# Patient Record
Sex: Male | Born: 1965 | Race: White | Hispanic: No | Marital: Married
Health system: Southern US, Community
[De-identification: ages and names within clinical notes are randomized; demographics above are authoritative.]

---

## 2019-09-30 ENCOUNTER — Encounter (HOSPITAL_COMMUNITY)
Admission: EM | Disposition: A | Discharge status: Home / Self Care | Payer: Self-pay | Source: Home / Self Care | Attending: Neurosurgery

## 2019-09-30 ENCOUNTER — Encounter (HOSPITAL_COMMUNITY): Payer: Self-pay | Admitting: Emergency Medicine

## 2019-09-30 ENCOUNTER — Inpatient Hospital Stay (HOSPITAL_COMMUNITY)
Admission: EM | Admit: 2019-09-30 | Discharge: 2019-10-07 | DRG: 066 | Disposition: A | Discharge status: Home / Self Care | Payer: BC Managed Care – PPO | Attending: Neurosurgery | Admitting: Neurosurgery

## 2019-09-30 ENCOUNTER — Emergency Department (HOSPITAL_COMMUNITY): Payer: BC Managed Care – PPO

## 2019-09-30 ENCOUNTER — Inpatient Hospital Stay (HOSPITAL_COMMUNITY): Payer: BC Managed Care – PPO | Admitting: Certified Registered"

## 2019-09-30 ENCOUNTER — Inpatient Hospital Stay (HOSPITAL_COMMUNITY): Payer: BC Managed Care – PPO

## 2019-09-30 DIAGNOSIS — I609 Nontraumatic subarachnoid hemorrhage, unspecified: Secondary | ICD-10-CM | POA: Diagnosis not present

## 2019-09-30 DIAGNOSIS — Z20822 Contact with and (suspected) exposure to covid-19: Secondary | ICD-10-CM | POA: Diagnosis not present

## 2019-09-30 DIAGNOSIS — R001 Bradycardia, unspecified: Secondary | ICD-10-CM | POA: Diagnosis not present

## 2019-09-30 DIAGNOSIS — Z79899 Other long term (current) drug therapy: Secondary | ICD-10-CM | POA: Diagnosis not present

## 2019-09-30 HISTORY — PX: IR ANGIO VERTEBRAL SEL VERTEBRAL BILAT MOD SED: IMG5369

## 2019-09-30 HISTORY — PX: RADIOLOGY WITH ANESTHESIA: SHX6223

## 2019-09-30 HISTORY — PX: IR ANGIO INTRA EXTRACRAN SEL INTERNAL CAROTID BILAT MOD SED: IMG5363

## 2019-09-30 LAB — COMPREHENSIVE METABOLIC PANEL
ALT: 52 U/L — ABNORMAL HIGH (ref 0–44)
AST: 39 U/L (ref 15–41)
Albumin: 4.9 g/dL (ref 3.5–5.0)
Alkaline Phosphatase: 75 U/L (ref 38–126)
Anion gap: 11 (ref 5–15)
BUN: 14 mg/dL (ref 6–20)
CO2: 26 mmol/L (ref 22–32)
Calcium: 9.4 mg/dL (ref 8.9–10.3)
Chloride: 102 mmol/L (ref 98–111)
Creatinine, Ser: 0.86 mg/dL (ref 0.61–1.24)
GFR calc Af Amer: >60 mL/min (ref >60)
GFR calc non Af Amer: >60 mL/min (ref >60)
Glucose, Bld: 116 mg/dL — ABNORMAL HIGH (ref 70–99)
Potassium: 4.6 mmol/L (ref 3.5–5.1)
Sodium: 139 mmol/L (ref 135–145)
Total Bilirubin: 1.2 mg/dL (ref 0.3–1.2)
Total Protein: 7.2 g/dL (ref 6.5–8.1)

## 2019-09-30 LAB — SARS CORONAVIRUS 2 BY RT PCR (HOSPITAL ORDER, PERFORMED IN ~~LOC~~ HOSPITAL LAB): SARS Coronavirus 2: NEGATIVE

## 2019-09-30 LAB — CBC
HCT: 46.7 % (ref 39.0–52.0)
Hemoglobin: 15.8 g/dL (ref 13.0–17.0)
MCH: 31.6 pg (ref 26.0–34.0)
MCHC: 33.8 g/dL (ref 30.0–36.0)
MCV: 93.4 fL (ref 80.0–100.0)
Platelets: 201 10*3/uL (ref 150–400)
RBC: 5 MIL/uL (ref 4.22–5.81)
RDW: 12.6 % (ref 11.5–15.5)
WBC: 7.8 10*3/uL (ref 4.0–10.5)
nRBC: 0 % (ref 0.0–0.2)

## 2019-09-30 LAB — PROTIME-INR
INR: 1.1 (ref 0.8–1.2)
Prothrombin Time: 13.7 seconds (ref 11.4–15.2)

## 2019-09-30 LAB — MRSA PCR SCREENING: MRSA by PCR: NEGATIVE

## 2019-09-30 LAB — APTT: aPTT: 25 seconds (ref 24–36)

## 2019-09-30 SURGERY — IR WITH ANESTHESIA
Anesthesia: Monitor Anesthesia Care

## 2019-09-30 MED ORDER — STROKE: EARLY STAGES OF RECOVERY BOOK
Freq: Once | Status: DC
  Filled 2019-09-30: qty 1

## 2019-09-30 MED ORDER — IOHEXOL 300 MG/ML  SOLN
150.0000 mL | Freq: Once | INTRAMUSCULAR | Status: AC | PRN
  Administered 2019-09-30: 60 mL via INTRA_ARTERIAL

## 2019-09-30 MED ORDER — NIMODIPINE 30 MG PO CAPS
60.0000 mg | ORAL_CAPSULE | ORAL | Status: DC
  Administered 2019-09-30 – 2019-10-07 (×39): 60 mg via ORAL
  Filled 2019-09-30 (×45): qty 2

## 2019-09-30 MED ORDER — DOCUSATE SODIUM 100 MG PO CAPS
100.0000 mg | ORAL_CAPSULE | Freq: Two times a day (BID) | ORAL | Status: DC
  Administered 2019-10-02 – 2019-10-04 (×6): 100 mg via ORAL
  Filled 2019-09-30 (×8): qty 1

## 2019-09-30 MED ORDER — CHLORHEXIDINE GLUCONATE CLOTH 2 % EX PADS
6.0000 | MEDICATED_PAD | Freq: Every day | CUTANEOUS | Status: DC
  Administered 2019-10-01 – 2019-10-07 (×4): 6 via TOPICAL

## 2019-09-30 MED ORDER — PANTOPRAZOLE SODIUM 40 MG PO PACK: 40.0000 mg | PACK | Freq: Every day | ORAL | Status: DC

## 2019-09-30 MED ORDER — LACTATED RINGERS IV SOLN: INTRAVENOUS | Status: DC

## 2019-09-30 MED ORDER — LABETALOL HCL 5 MG/ML IV SOLN
20.0000 mg | Freq: Once | INTRAVENOUS | Status: DC
  Filled 2019-09-30: qty 4

## 2019-09-30 MED ORDER — NIMODIPINE 6 MG/ML PO SOLN
60.0000 mg | Freq: Once | ORAL | Status: AC
  Administered 2019-09-30: 60 mg via ORAL
  Filled 2019-09-30: qty 20

## 2019-09-30 MED ORDER — ATORVASTATIN CALCIUM 40 MG PO TABS
40.0000 mg | ORAL_TABLET | Freq: Every day | ORAL | Status: DC
  Administered 2019-09-30 – 2019-10-07 (×8): 40 mg via ORAL
  Filled 2019-09-30 (×8): qty 1

## 2019-09-30 MED ORDER — MORPHINE SULFATE (PF) 4 MG/ML IV SOLN
4.0000 mg | Freq: Once | INTRAVENOUS | Status: AC
  Administered 2019-09-30: 4 mg via INTRAVENOUS
  Filled 2019-09-30: qty 1

## 2019-09-30 MED ORDER — NICARDIPINE HCL IN NACL 20-0.86 MG/200ML-% IV SOLN
0.0000 mg/h | INTRAVENOUS | Status: DC
  Administered 2019-09-30: 5 mg/h via INTRAVENOUS
  Filled 2019-09-30 (×2): qty 200

## 2019-09-30 MED ORDER — LIDOCAINE HCL 1 % IJ SOLN
INTRAMUSCULAR | Status: AC | PRN
  Administered 2019-09-30: 10 mL

## 2019-09-30 MED ORDER — MORPHINE SULFATE (PF) 2 MG/ML IV SOLN
1.0000 mg | INTRAVENOUS | Status: DC | PRN
  Administered 2019-09-30: 2 mg via INTRAVENOUS
  Administered 2019-09-30: 4 mg via INTRAVENOUS
  Administered 2019-10-04: 1 mg via INTRAVENOUS
  Filled 2019-09-30: qty 2
  Filled 2019-09-30 (×2): qty 1

## 2019-09-30 MED ORDER — PANTOPRAZOLE SODIUM 40 MG PO TBEC
40.0000 mg | DELAYED_RELEASE_TABLET | Freq: Every day | ORAL | Status: DC
  Administered 2019-09-30 – 2019-10-07 (×8): 40 mg via ORAL
  Filled 2019-09-30 (×8): qty 1

## 2019-09-30 MED ORDER — LIDOCAINE HCL 1 % IJ SOLN
INTRAMUSCULAR | Status: AC
  Filled 2019-09-30: qty 20

## 2019-09-30 MED ORDER — ACETAMINOPHEN 160 MG/5ML PO SOLN: 650.0000 mg | ORAL | Status: DC | PRN

## 2019-09-30 MED ORDER — MIDAZOLAM HCL 2 MG/2ML IJ SOLN
INTRAMUSCULAR | Status: DC | PRN
Start: 2019-09-30 — End: 2019-09-30
  Administered 2019-09-30 (×2): 2 mg via INTRAVENOUS

## 2019-09-30 MED ORDER — LACTATED RINGERS IV SOLN
INTRAVENOUS | Status: DC | PRN
Start: 2019-09-30 — End: 2019-09-30

## 2019-09-30 MED ORDER — ONDANSETRON 4 MG PO TBDP
4.0000 mg | ORAL_TABLET | Freq: Four times a day (QID) | ORAL | Status: DC | PRN
  Administered 2019-10-06: 4 mg via ORAL
  Filled 2019-09-30: qty 1

## 2019-09-30 MED ORDER — ACETAMINOPHEN 325 MG PO TABS
650.0000 mg | ORAL_TABLET | ORAL | Status: DC | PRN
  Administered 2019-09-30 – 2019-10-04 (×17): 650 mg via ORAL
  Administered 2019-10-04: 325 mg via ORAL
  Administered 2019-10-04 – 2019-10-07 (×14): 650 mg via ORAL
  Filled 2019-09-30 (×32): qty 2

## 2019-09-30 MED ORDER — NICARDIPINE HCL IN NACL 20-0.86 MG/200ML-% IV SOLN
0.0000 mg/h | INTRAVENOUS | Status: DC
  Administered 2019-09-30: 2.5 mg/h via INTRAVENOUS
  Filled 2019-09-30: qty 200

## 2019-09-30 MED ORDER — CHLORHEXIDINE GLUCONATE 0.12 % MT SOLN
15.0000 mL | Freq: Once | OROMUCOSAL | Status: DC
  Filled 2019-09-30 (×2): qty 15

## 2019-09-30 MED ORDER — IOHEXOL 350 MG/ML SOLN
75.0000 mL | Freq: Once | INTRAVENOUS | Status: AC | PRN
  Administered 2019-09-30: 75 mL via INTRAVENOUS

## 2019-09-30 MED ORDER — NIMODIPINE 6 MG/ML PO SOLN
60.0000 mg | ORAL | Status: DC
  Filled 2019-09-30 (×27): qty 10

## 2019-09-30 MED ORDER — ONDANSETRON HCL 4 MG/2ML IJ SOLN
4.0000 mg | Freq: Four times a day (QID) | INTRAMUSCULAR | Status: DC | PRN
  Administered 2019-10-01 – 2019-10-07 (×6): 4 mg via INTRAVENOUS
  Filled 2019-09-30 (×6): qty 2

## 2019-09-30 MED ORDER — IOHEXOL 240 MG/ML SOLN
INTRAMUSCULAR | Status: AC
  Filled 2019-09-30: qty 100

## 2019-09-30 MED ORDER — SODIUM CHLORIDE 0.9 % IV SOLN: INTRAVENOUS | Status: DC

## 2019-09-30 MED ORDER — ACETAMINOPHEN 650 MG RE SUPP: 650.0000 mg | RECTAL | Status: DC | PRN

## 2019-09-30 NOTE — Anesthesia Procedure Notes (Signed)
Procedure Name: MAC Date/Time: 09/30/2019 7:13 PM Performed by: Claris Che, CRNA Pre-anesthesia Checklist: Patient identified, Emergency Drugs available, Suction available, Patient being monitored and Timeout performed Patient Re-evaluated:Patient Re-evaluated prior to induction Oxygen Delivery Method: Simple face mask

## 2019-09-30 NOTE — Anesthesia Preprocedure Evaluation (Addendum)
Anesthesia Evaluation  Patient identified by MRN, date of birth, ID band Patient awake    Reviewed: Allergy & Precautions, H&P , NPO status , Patient's Chart, lab work & pertinent test results  Airway Mallampati: III  TM Distance: >3 FB Neck ROM: Full    Dental no notable dental hx. (+) Teeth Intact, Dental Advisory Given   Pulmonary neg pulmonary ROS,    Pulmonary exam normal breath sounds clear to auscultation       Cardiovascular negative cardio ROS   Rhythm:Regular Rate:Normal     Neuro/Psych  Headaches, negative psych ROS   GI/Hepatic negative GI ROS, Neg liver ROS,   Endo/Other  negative endocrine ROS  Renal/GU negative Renal ROS  negative genitourinary   Musculoskeletal   Abdominal   Peds  Hematology negative hematology ROS (+)   Anesthesia Other Findings   Reproductive/Obstetrics negative OB ROS                            Anesthesia Physical Anesthesia Plan  ASA: II  Anesthesia Plan: MAC   Post-op Pain Management:    Induction: Intravenous  PONV Risk Score and Plan: 1 and Midazolam and Treatment may vary due to age or medical condition  Airway Management Planned: Nasal Cannula  Additional Equipment:   Intra-op Plan:   Post-operative Plan:   Informed Consent: I have reviewed the patients History and Physical, chart, labs and discussed the procedure including the risks, benefits and alternatives for the proposed anesthesia with the patient or authorized representative who has indicated his/her understanding and acceptance.     Dental advisory given  Plan Discussed with: CRNA  Anesthesia Plan Comments:       Anesthesia Quick Evaluation

## 2019-09-30 NOTE — H&P (Signed)
Chief Complaint   Chief Complaint  Patient presents with  . Headache    History of Present Illness  [facility] is a 54 y.o. male presented to the emergency department with sudden onset of severe headache approximately 530 this morning.  This happened while he was getting up out of bed.  There was associated nausea and vomiting.  He attempted to drive to work but had continued headache and vomiting and went to the urgent care instead.  He was ultimately transferred to [facility] where CT scan demonstrated basal subarachnoid hemorrhage suspicious for aneurysmal etiology.  He was then transferred to [facility] for further care.  Upon questioning, the patient denies any associated blurry or double vision, numbness, tingling, or weakness.  He did not have any difficulty walking.  No difficulty with urination.  Of note, the patient does not have a history of hypertension, cardiac disease, or any other medical problems.  He does take a statin.  He is a non-smoker.  He has a cousin when he was a child who died of aneurysmal subarachnoid hemorrhage, but other no known family members with aneurysms or brain hemorrhage.  Past Medical History  History reviewed. No pertinent past medical history.  Past Surgical History  History reviewed. No pertinent surgical history.  Social History   Social History   Tobacco Use  . Smoking status: Not on file  Substance Use Topics  . Alcohol use: Not on file  . Drug use: Not on file    Medications   Prior to Admission medications   Medication Sig Start Date End Date Taking? Authorizing Provider  atorvastatin (LIPITOR) 40 MG tablet Take 40 mg by mouth daily. 09/21/19  Yes [provider]  cholecalciferol (VITAMIN D3) 25 MCG (1000 UNIT) tablet Take 1,000 Units by mouth daily.   Yes [provider]  ibuprofen (ADVIL) 200 MG tablet Take 200 mg by mouth every 6 (six) hours as needed for headache or moderate pain.   Yes [provider]  Multiple Vitamins-Minerals (ZINC PO) Take 1 tablet by mouth daily.   Yes [provider]    Allergies  No Known Allergies  Review of Systems  ROS  Neurologic Exam  Awake, alert, oriented Memory and concentration grossly intact Speech fluent, appropriate CN grossly intact Motor exam: Upper Extremities Deltoid Bicep Tricep Grip  Right 5/5 5/5 5/5 5/5  Left 5/5 5/5 5/5 5/5   Lower Extremities IP Quad PF DF EHL  Right 5/5 5/5 5/5 5/5 5/5  Left 5/5 5/5 5/5 5/5 5/5   Sensation grossly intact to LT  Imaging  CT scan of the brain was personally reviewed.  This demonstrates primarily posterior fossa subarachnoid hemorrhage including perimedullary and perimesencephalic hemorrhage.  There is minimal amount of supratentorial hemorrhage.  Ventricles are somewhat prominent.  CT angiogram of the brain was also personally reviewed.  I do not see any intracranial aneurysms.  Impression  - 54 y.o. male presenting with subarachnoid hemorrhage, Hunt Hess grade 2, Fisher grade 3.  Initial CT angiogram was negative.  Patient is neurologically at baseline.  Plan  -We will plan on proceeding with diagnostic cerebral angiogram for further work-up.  I did review the imaging findings thus far with the patient.  I explained him that while aneurysm is the most common cause of subarachnoid hemorrhage given the negative CTA which is of reasonable quality I think this may be a venous hemorrhage.  We did discuss the need for confirmatory gold standard testing  with angiogram.  I discussed the details of the procedure as well as the associated risks including very low risk of stroke, arterial dissection, contrast reaction, nephropathy, and groin hematoma.  In the unlikely event of positive angiogram, we did also discuss the possibility of endovascular treatment of an aneurysm with coil embolization versus surgical clip ligation.  Patient appeared to understand our discussion and is  willing to proceed with the angiogram.  All his questions were answered.  Informed consent was then obtained and witnessed.

## 2019-09-30 NOTE — Transfer of Care (Signed)
Immediate Anesthesia Transfer of Care Note  Patient: Narada Uzzle  Procedure(s) Performed: IR WITH ANESTHESIA (N/A )  Patient Location: ICU  Anesthesia Type:MAC  Level of Consciousness: awake, alert , oriented and patient cooperative  Airway & Oxygen Therapy: Patient Spontanous Breathing  Post-op Assessment: Report given to RN, Post -op Vital signs reviewed and stable and Patient moving all extremities X 4  Post vital signs: Reviewed and stable  Last Vitals:  Vitals Value Taken Time  BP 123/64 09/30/19 2011  Temp    Pulse 64 09/30/19 2014  Resp 15 09/30/19 2014  SpO2 95 % 09/30/19 2014  Vitals shown include unvalidated device data.  Last Pain:  Vitals:   09/30/19 1711  TempSrc:   PainSc: 4          Complications: No complications documented.

## 2019-09-30 NOTE — Progress Notes (Signed)
Wife Angelique Blonder)- call phone number for her first, if you cannot reach her please call his daughter. Daughter Florentina Addison): [phone]

## 2019-09-30 NOTE — ED Provider Notes (Signed)
[facility]-EMERGENCY DEPT Provider Note   CSN: [id number] Arrival date & time: 09/30/19  1011     History Chief Complaint  Patient presents with  . Headache    Bowden Boody is a 54 y.o. male.  HPI  54 year old male, no significant medical history, presents with headache.  Patient states that he woke up this morning around 5:30 AM with some mild neck pain.  He felt that his neck was stiff and it was from the way that he slept.  He then started having sudden onset of a headache radiating from the neck to the back of the head.  He describes it as an ache and states that the pain was the "worst headache ever had."  He notes he had associated nausea and vomiting.  He drove to urgent care and was told to come to the emergency room for further evaluation.  Patient was given Zofran at urgent care and states since receiving Zofran he has felt significantly better and his headache has improved from an 8 out of 10 to a 3 out of 10.  He denies any numbness, tingling, vision images, speech or gait difficulty.     History reviewed. No pertinent past medical history.  There are no problems to display for this patient.   History reviewed. No pertinent surgical history.     No family history on file.  Social History   Tobacco Use  . Smoking status: Not on file  Substance Use Topics  . Alcohol use: Not on file  . Drug use: Not on file    Home Medications Prior to Admission medications   Medication Sig Start Date End Date Taking? Authorizing Provider  atorvastatin (LIPITOR) 40 MG tablet Take 40 mg by mouth daily. 09/21/19   [provider]    Allergies    Patient has no allergy information on record.  Review of Systems   Review of Systems  Constitutional: Negative for chills and fever.  HENT: Negative for rhinorrhea and sore throat.   Eyes: Negative for visual disturbance.  Respiratory: Negative for cough and shortness of breath.   Cardiovascular:  Negative for chest pain and leg swelling.  Gastrointestinal: Positive for nausea and vomiting. Negative for abdominal pain and diarrhea.  Genitourinary: Negative for dysuria, frequency and urgency.  Musculoskeletal: Negative for joint swelling and neck pain.  Skin: Negative for rash and wound.  Neurological: Positive for headaches. Negative for dizziness, syncope, facial asymmetry, weakness, light-headedness and numbness.  All other systems reviewed and are negative.   Physical Exam Updated Vital Signs BP (!) 144/78   Pulse 68   Temp (!) 97.4 F (36.3 C) (Oral)   Resp 18   SpO2 98%   Physical Exam Vitals and nursing note reviewed.  Constitutional:      Appearance: He is well-developed.  HENT:     Head: Normocephalic and atraumatic.  Eyes:     Conjunctiva/sclera: Conjunctivae normal.  Cardiovascular:     Rate and Rhythm: Normal rate and regular rhythm.     Heart sounds: Normal heart sounds. No murmur heard.   Pulmonary:     Effort: Pulmonary effort is normal. No respiratory distress.     Breath sounds: Normal breath sounds. No wheezing or rales.  Abdominal:     General: Bowel sounds are normal. There is no distension.     Palpations: Abdomen is soft.     Tenderness: There is no abdominal tenderness.  Musculoskeletal:        General:  No tenderness or deformity. Normal range of motion.     Cervical back: Neck supple.  Skin:    General: Skin is warm and dry.     Findings: No erythema or rash.  Neurological:     General: No focal deficit present.     Mental Status: He is alert and oriented to person, place, and time.     Cranial Nerves: Cranial nerves are intact.     Sensory: Sensation is intact.     Motor: Motor function is intact.     Coordination: Coordination is intact.     Gait: Gait is intact.  Psychiatric:        Behavior: Behavior normal.     ED Results / Procedures / Treatments   Labs (all labs ordered are listed, but only abnormal results are  displayed) Labs Reviewed  COMPREHENSIVE METABOLIC PANEL - Abnormal; Notable for the following components:      Result Value   Glucose, Bld 116 (*)    ALT 52 (*)    All other components within normal limits  CBC  URINALYSIS, ROUTINE W REFLEX MICROSCOPIC  APTT  PROTIME-INR    EKG None  Radiology CT Head Wo Contrast  Result Date: 09/30/2019 CLINICAL DATA:  54 year old with headache. EXAM: CT HEAD WITHOUT CONTRAST TECHNIQUE: Contiguous axial images were obtained from the base of the skull through the vertex without intravenous contrast. COMPARISON:  None. FINDINGS: Brain: Positive for subarachnoid hemorrhage. There is blood in the perimesencephalic cistern including the interpeduncular fossa and ambient cisterns. There is also high-density material layering along the left side of the tentorium and difficult to exclude a small amount of subdural blood. Difficult to exclude a small amount of blood in the right sylvian fissure. The lateral ventricles are slightly enlarged and compatible with mild hydrocephalus. No focal intraparenchymal hemorrhage. No evidence for midline shift or large infarct. Vascular: No hyperdense vessel or unexpected calcification. Skull: Normal. Negative for fracture or focal lesion. Sinuses/Orbits: No acute finding. Other: None. IMPRESSION: 1. Positive for intracranial hemorrhage. Subarachnoid hemorrhage in the perimesencephalic cistern. In addition, there is high-density material along the tentorium and cannot exclude small subdural hemorrhage. Findings raise concern for a ruptured aneurysm and recommend further characterization with CTA. 2. Mild hydrocephalus. Critical Value/emergent results were called by telephone at the time of interpretation on 09/30/2019 at 1:01 pm to provider [facility] , who verbally acknowledged these results. Electronically Signed   By: Richarda Overlie M.D.   On: 09/30/2019 13:13    Procedures .Critical Care Performed by: Clayborne Artist,  PA-C Authorized by: Clayborne Artist, PA-C   Critical care provider statement:    Critical care time (minutes):  45   Critical care was necessary to treat or prevent imminent or life-threatening deterioration of the following conditions:  CNS failure or compromise   Critical care was time spent personally by me on the following activities:  Discussions with consultants, evaluation of patient's response to treatment, examination of patient, ordering and performing treatments and interventions, ordering and review of laboratory studies, ordering and review of radiographic studies, pulse oximetry, re-evaluation of patient's condition, obtaining history from patient or surrogate and review of old charts   (including critical care time)  Medications Ordered in ED Medications  niMODipine (NYMALIZE) 6 MG/ML oral solution 60 mg (has no administration in time range)    ED Course  I have reviewed the triage vital signs and the nursing notes.  Pertinent labs & imaging results that were available during  my care of the patient were reviewed by me and considered in my medical decision making (see chart for details).    MDM Rules/Calculators/A&P                           Patient presents with headache.  No significant medical history.  Vital signs stable on arrival.  Neuro exam nonfocal, nonlateralizing.  Blood work ordered in triage.  After evaluation CT head without contrast ordered for further evaluation for subarachnoid hemorrhage.  CT of the head positive for subarachnoid.  Neurosurgery paged.  Discussed findings with patient who expressed understanding.  Discussed with attending, Dr. Lynelle Doctor, who added Nimodipine.  1329: Discussed with neurosurgery, Dr. Maisie Fus.  Request CT angio head and neck.  CT ordered. Patient continues to be very well-appearing, no acute distress, nontoxic, non-lethargic.  Continues to decline any pain medicine.   1416: Discussed again with neurosurgery, Dr. Maisie Fus.  He  request that admit orders to be placed for ICU at [facility] with attending, Dr. Conchita Paris as excepting.  Recommends Cardene drip as needed to maintain a map between 70 and 90. Discussed with case management who will work with security to get patient's belongings from his car.   1530: Reevaluated patient and he continues to be very well-appearing, no acute distress, nontoxic, non-lethargic.  He was started on Cardene drip as his map was slightly elevated above 90.  He is responding well to Cardene drip.  He does note his headache is starting to worsen and describes it as a 5 out of 10.  He is requesting pain medication at this time.  Discussed with attending, Dr. Lynelle Doctor and recommended narcotic pain medication at this time.  Will treat with morphine.  Discussed with before meals and a bed is available at [facility] patient is next on the list and should be getting transferred soon.   Final Clinical Impression(s) / ED Diagnoses Final diagnoses:  Subarachnoid hemorrhage [facility])    Rx / DC Orders ED Discharge Orders    None       Rueben Bash 09/30/19 2033    Linwood Dibbles, MD 10/01/19 [phone]

## 2019-09-30 NOTE — Sedation Documentation (Signed)
Right femoral sheath removed, 60fr exoseal closure device used.

## 2019-09-30 NOTE — Brief Op Note (Signed)
  NEUROSURGERY BRIEF OPERATIVE  NOTE   PREOP DX: Subarachnoid Hemorrhage  POSTOP DX: Same  PROCEDURE: Diagnostic cerebral angiogram  SURGEON: Dr. Lisbeth Renshaw, MD  ANESTHESIA: IV Sedation with Local  EBL: Minimal  SPECIMENS: None  COMPLICATIONS: None  CONDITION: Stable to recovery  FINDINGS (Full report in [facility]): 1. Normal angiogram, no aneurysms, AVM, or fistulas seen.

## 2019-09-30 NOTE — ED Provider Notes (Signed)
Medical screening examination/treatment/procedure(s) were conducted as a shared visit with non-physician practitioner(s) and myself.  I personally evaluated the patient during the encounter.   Pt presents with sudden onset headache.  CT scan shows a subarachnoid hemorrhage.  Pt is currently, awake and alert, CCS 15.  Mild headache.  Discussed with neurosurgery.  Pt will be transferred to [facility] neuro icu.  CT angio is pending.  Case discussed with Dr Maisie Fus.  Dr Conchita Paris will admit.  I have placed a bed request to neuro icu.   Linwood Dibbles, MD 09/30/19 [phone]

## 2019-09-30 NOTE — Progress Notes (Signed)
TOC CM received referral to assist pt with picking up his belongings from his car at the Urgent Care on [address]. Contacted Security and that area is out of their jurisdiction. Pt states his wife is flying into town on tonight and we can arrange [employer] [employer] for her to pick up pt's rental car. ED provider updated.  Isidoro Donning RN CCM, [facility] ED TOC CM [phone]

## 2019-09-30 NOTE — ED Triage Notes (Signed)
Per EMS-headache that radiates to neck and shoulders-was seen at [facility] for same-nausea and vomited-given Zofran which has helped with N/V-sent here for further eval-rapid covid done-has not resulted

## 2019-09-30 NOTE — ED Notes (Signed)
Pt transported to CT ?

## 2019-09-30 NOTE — Anesthesia Postprocedure Evaluation (Signed)
Anesthesia Post Note  Patient: [facility]  Procedure(s) Performed: IR WITH ANESTHESIA (N/A )     Patient location during evaluation: PACU Anesthesia Type: MAC Level of consciousness: awake and alert Pain management: pain level controlled Vital Signs Assessment: post-procedure vital signs reviewed and stable Respiratory status: spontaneous breathing, nonlabored ventilation and respiratory function stable Cardiovascular status: stable and blood pressure returned to baseline Postop Assessment: no apparent nausea or vomiting Anesthetic complications: no   No complications documented.  Last Vitals:  Vitals:   09/30/19 1735 09/30/19 2011  BP: (!) 133/71 (!) 123/64  Pulse: 70   Resp: 16 (!) 10  Temp:  36.8 C  SpO2: 99% 97%    Last Pain:  Vitals:   09/30/19 2011  TempSrc: Oral  PainSc:                  [name],W. EDMOND

## 2019-09-30 NOTE — ED Notes (Signed)
[facility] called for transport. 

## 2019-09-30 NOTE — ED Notes (Signed)
Per charge RN an ICU is being cleaned at [facility] for this patient. This RN called [facility] to go ahead and have pt on the list.

## 2019-10-01 ENCOUNTER — Inpatient Hospital Stay (HOSPITAL_COMMUNITY): Payer: BC Managed Care – PPO

## 2019-10-01 ENCOUNTER — Encounter (HOSPITAL_COMMUNITY): Payer: Self-pay | Admitting: Neurosurgery

## 2019-10-01 DIAGNOSIS — I609 Nontraumatic subarachnoid hemorrhage, unspecified: Secondary | ICD-10-CM

## 2019-10-01 LAB — HIV ANTIBODY (ROUTINE TESTING W REFLEX): HIV Screen 4th Generation wRfx: NONREACTIVE

## 2019-10-01 NOTE — Evaluation (Addendum)
Physical Therapy Evaluation Patient Details Name: Robert Gilbert MRN: [id number] DOB: Oct 05, 1965 Today's Date: 10/01/2019   History of Present Illness  54 y.o. male presented to the emergency department with sudden onset of severe headache approximately 530 this morning.  This happened while he was getting up out of bed.  There was associated nausea and vomiting.  He attempted to drive to work but had continued headache and vomiting and went to the urgent care instead.  He was ultimately transferred to [facility] where CT scan demonstrated basal subarachnoid hemorrhage suspicious for aneurysmal etiology.  He was then transferred to [facility] for further care. Pt underwent diagnostic cerebral angiogram on 7/29 which was normal.  Clinical Impression  Pt presents to PT with no significant functional deficits in mobility, ambulating and negotiating stairs independently. Pt does report some increased nausea when looking downward but denies any dizziness, double vision, or changes in vision during session. Pt reports he feels close to his baseline in terms of mobility at this time and expresses no concerns about mobility at this time. Pt is encouraged to ambulate out of the room at least 3 times per day for the remainder of his hospitalization. Please re-consult acute PT services if any further mobility concerns arise. Acute PT signing off.    Follow Up Recommendations No PT follow up    Equipment Recommendations  None recommended by PT    Recommendations for Other Services       Precautions / Restrictions Precautions Precautions: None Restrictions Weight Bearing Restrictions: No      Mobility  Bed Mobility Overal bed mobility: Independent                Transfers Overall transfer level: Independent                  Ambulation/Gait Ambulation/Gait assistance: Independent Gait Distance (Feet): 300 Feet Assistive device: None Gait Pattern/deviations: Step-through  pattern;WFL(Within Functional Limits) Gait velocity: functional Gait velocity interpretation: >2.62 ft/sec, indicative of community ambulatory General Gait Details: steady step through gait, able to perform horizontal head turns, does report an increase in nausea when looking down, able to change gait speed and stop abruptly without balance deviaitons  Stairs Stairs: Yes Stairs assistance: Independent Stair Management: No rails Number of Stairs: 3    Wheelchair Mobility    Modified Rankin (Stroke Patients Only) Modified Rankin (Stroke Patients Only) Pre-Morbid Rankin Score: No symptoms Modified Rankin: No significant disability     Balance Overall balance assessment: Independent                                           Pertinent Vitals/Pain Pain Assessment: 0-10 Pain Score: 5  Pain Location: head Pain Descriptors / Indicators: Aching Pain Intervention(s): Monitored during session    Home Living Family/patient expects to be discharged to:: Private residence Living Arrangements: Spouse/significant other Available Help at Discharge: Family;Available 24 hours/day Type of Home: House Home Access: Stairs to enter Entrance Stairs-Rails: None Entrance Stairs-Number of Steps: 2 Home Layout: One level Home Equipment: Cane - single point      Prior Function Level of Independence: Independent               Hand Dominance   Dominant Hand: Right    Extremity/Trunk Assessment   Upper Extremity Assessment Upper Extremity Assessment: Overall WFL for tasks assessed    Lower Extremity  Assessment Lower Extremity Assessment: Overall WFL for tasks assessed    Cervical / Trunk Assessment Cervical / Trunk Assessment: Normal  Communication   Communication: No difficulties  Cognition Arousal/Alertness: Awake/alert Behavior During Therapy: WFL for tasks assessed/performed Overall Cognitive Status: Within Functional Limits for tasks assessed                                         General Comments General comments (skin integrity, edema, etc.): VSS on RA    Exercises     Assessment/Plan    PT Assessment Patent does not need any further PT services  PT Problem List         PT Treatment Interventions      PT Goals (Current goals can be found in the Care Plan section)       Frequency     Barriers to discharge        Co-evaluation               AM-PAC PT "6 Clicks" Mobility  Outcome Measure Help needed turning from your back to your side while in a flat bed without using bedrails?: None Help needed moving from lying on your back to sitting on the side of a flat bed without using bedrails?: None Help needed moving to and from a bed to a chair (including a wheelchair)?: None Help needed standing up from a chair using your arms (e.g., wheelchair or bedside chair)?: None Help needed to walk in hospital room?: None Help needed climbing 3-5 steps with a railing? : None 6 Click Score: 24    End of Session   Activity Tolerance: Patient tolerated treatment well Patient left: in chair;with family/visitor present;with call bell/phone within reach Nurse Communication: Mobility status      Time: 9604-5409 PT Time Calculation (min) (ACUTE ONLY): 22 min   Charges:   PT Evaluation $PT Eval Moderate Complexity: 1 Mod          Arlyss Gandy, PT, DPT Acute Rehabilitation Pager: [phone]   Arlyss Gandy 10/01/2019, 9:38 AM

## 2019-10-01 NOTE — Progress Notes (Signed)
Transcranial Doppler  Date POD PCO2 HCT BP  MCA ACA PCA OPHT SIPH VERT Basilar  7/30 GC     Right  Left   *  43   *  *   *  *   18  15   19  19    -28  -24   -41           Right  Left                                            Right  Left                                             Right  Left                                             Right  Left                                            Right  Left                                            Right  Left                                        MCA = Middle Cerebral Artery      OPHT = Opthalmic Artery     BASILAR = Basilar Artery   ACA = Anterior Cerebral Artery     SIPH = Carotid Siphon PCA = Posterior Cerebral Artery   VERT = Verterbral Artery                   Normal MCA = 62+\-12 ACA = 50+\-12 PCA = 42+\-23  * Unable to insonate  10/01/19 12:04 PM 10/03/19 RVT

## 2019-10-01 NOTE — Plan of Care (Signed)
  Problem: Spontaneous Subarachnoid Hemorrhage Tissue Perfusion: Goal: Complications of Spontaneous Subarachnoid Hemorrhage will be minimized Outcome: Progressing

## 2019-10-01 NOTE — Evaluation (Signed)
Occupational Therapy Evaluation Patient Details Name: Robert Gilbert MRN: [id number] DOB: [birth year] Today's Date: 10/01/2019    History of Present Illness 54 y.o. male presented to the emergency department with sudden onset of severe headache approximately 530 this morning.  This happened while he was getting up out of bed.  There was associated nausea and vomiting.  He attempted to drive to work but had continued headache and vomiting and went to the urgent care instead.  He was ultimately transferred to [facility] where CT scan demonstrated basal subarachnoid hemorrhage suspicious for aneurysmal etiology.  He was then transferred to [facility] for further care. Pt underwent diagnostic cerebral angiogram on 7/29 which was normal.   Clinical Impression   Patient evaluated by Occupational Therapy with no further acute OT needs identified. All education has been completed and the patient has no further questions. Pt appears at baseline.  No cognitive or visual deficits noted.   See below for any follow-up Occupational Therapy or equipment needs. OT is signing off. Thank you for this referral.      Follow Up Recommendations  No OT follow up    Equipment Recommendations  None recommended by OT    Recommendations for Other Services       Precautions / Restrictions Precautions Precautions: None Restrictions Weight Bearing Restrictions: No      Mobility Bed Mobility Overal bed mobility: Independent                Transfers Overall transfer level: Independent                    Balance Overall balance assessment: Independent                                         ADL either performed or assessed with clinical judgement   ADL Overall ADL's : Independent                                       General ADL Comments: Pt standing at sink grooming and cleaning up upon OT entrance      Vision Baseline Vision/History: No  visual deficits Patient Visual Report: No change from baseline Vision Assessment?: No apparent visual deficits Additional Comments: Pt able to scan environment without difficulty.  locates needed items and able to recall landmarks during path finding      Perception Perception Perception Tested?: Yes   Praxis Praxis Praxis tested?: Within functional limits    Pertinent Vitals/Pain Pain Assessment: 0-10 Pain Score: 2  Pain Location: head Pain Descriptors / Indicators: Aching Pain Intervention(s): Monitored during session     Hand Dominance Right   Extremity/Trunk Assessment Upper Extremity Assessment Upper Extremity Assessment: Overall WFL for tasks assessed   Lower Extremity Assessment Lower Extremity Assessment: Overall WFL for tasks assessed   Cervical / Trunk Assessment Cervical / Trunk Assessment: Normal   Communication Communication Communication: No difficulties   Cognition Arousal/Alertness: Awake/alert Behavior During Therapy: WFL for tasks assessed/performed Overall Cognitive Status: Within Functional Limits for tasks assessed                                 General Comments: pt able to follow 3 step command, requires min cues for  4 step command while distracted by conversation.  He was able to perform serial 2's and serial 7's from 100 without difficulty while walking.  He was able to perform path finding without diffiuclty.  Did discuss potential cognitive deficits with pt and wife to monitor as complexity of tasks and distraction increases.     General Comments  VSS    Exercises     Shoulder Instructions      Home Living Family/patient expects to be discharged to:: Private residence Living Arrangements: Spouse/significant other Available Help at Discharge: Family;Available 24 hours/day Type of Home: House Home Access: Stairs to enter [employer] of Steps: 2 Entrance Stairs-Rails: None Home Layout: One level     Bathroom  Shower/Tub: Walk-in shower;Tub/shower unit   Bathroom Toilet: Standard     Home Equipment: Cane - single point          Prior Functioning/Environment Level of Independence: Independent                 OT Problem List: Pain      OT Treatment/Interventions:      OT Goals(Current goals can be found in the care plan section) Acute Rehab OT Goals Patient Stated Goal: to eventually return to work  OT Goal Formulation: All assessment and education complete, DC therapy  OT Frequency:     Barriers to D/C:            Co-evaluation              AM-PAC OT "6 Clicks" Daily Activity     Outcome Measure Help from another person eating meals?: None Help from another person taking care of personal grooming?: None Help from another person toileting, which includes using toliet, bedpan, or urinal?: None Help from another person bathing (including washing, rinsing, drying)?: None Help from another person to put on and taking off regular upper body clothing?: None Help from another person to put on and taking off regular lower body clothing?: None 6 Click Score: 24   End of Session    Activity Tolerance: Patient tolerated treatment well Patient left: in chair;with call bell/phone within reach;with family/visitor present  OT Visit Diagnosis: Pain Pain - part of body:  (headache )                Time: 4496-7591 OT Time Calculation (min): 22 min Charges:  OT General Charges $OT Visit: 1 Visit OT Evaluation $OT Eval Low Complexity: 1 Low  Eber Jones., OTR/L Acute Rehabilitation Services Pager [phone] Office [phone]   Jeani Hawking M 10/01/2019, 10:31 AM

## 2019-10-01 NOTE — Progress Notes (Signed)
  NEUROSURGERY PROGRESS NOTE   No issues overnight. Mild HA, improved from yesterday. No other complaints.  EXAM:  BP 119/75   Pulse 66   Temp 97.8 F (36.6 C) (Oral)   Resp 18   Ht 5\' 11"  (1.803 m)   Wt 84.4 kg   SpO2 96%   BMI 25.95 kg/m   Awake, alert, oriented  Speech fluent, appropriate  CN grossly intact  5/5 BUE/BLE   IMPRESSION:  54 y.o. male SAHd#2, angio negative. Remains neurologically intact  PLAN: - Cont observation - Cont Nimotop - TCD today - OOB ad lib

## 2019-10-02 NOTE — Plan of Care (Signed)
  Problem: Clinical Measurements: Goal: Ability to maintain clinical measurements within normal limits will improve Outcome: Progressing   

## 2019-10-02 NOTE — Progress Notes (Signed)
Overall doing well.  Patient still with some headache.  No numbness paresthesias or weakness.  No cranial nerve issues.  Awake and alert.  Oriented and appropriate.  Motor and sensory function intact.  Transcranial Dopplers stable.  Vital signs stable.  Status post angiogram negative subarachnoid hemorrhage.  Continue supportive efforts on observation.  Plan for angio later next week.

## 2019-10-03 NOTE — Plan of Care (Signed)
  Problem: Education: Goal: Knowledge of General Education information will improve Description: Including pain rating scale, medication(s)/side effects and non-pharmacologic comfort measures Outcome: Progressing   Problem: Clinical Measurements: Goal: Diagnostic test results will improve Outcome: Progressing   Problem: Activity: Goal: Risk for activity intolerance will decrease Outcome: Progressing   Problem: Nutrition: Goal: Adequate nutrition will be maintained Outcome: Progressing   Problem: Coping: Goal: Level of anxiety will decrease Outcome: Progressing   Problem: Pain Managment: Goal: General experience of comfort will improve Outcome: Progressing   Problem: Spontaneous Subarachnoid Hemorrhage Tissue Perfusion: Goal: Complications of Spontaneous Subarachnoid Hemorrhage will be minimized Outcome: Progressing

## 2019-10-03 NOTE — Progress Notes (Signed)
Overall stable.  Headache much improved.  No cranial nerve dysfunction.  No motor or sensory complaints.  Afebrile.  Vital signs are stable.  Awake and alert.  Oriented and appropriate.  Motor and sensory function intact.  Status post angiogram negative subarachnoid hemorrhage.  Okay to transfer to floor.

## 2019-10-04 ENCOUNTER — Inpatient Hospital Stay (HOSPITAL_COMMUNITY): Payer: BC Managed Care – PPO

## 2019-10-04 MED ORDER — HYDROMORPHONE HCL 1 MG/ML IJ SOLN
0.5000 mg | INTRAMUSCULAR | Status: DC | PRN
  Administered 2019-10-04 – 2019-10-06 (×2): 0.5 mg via INTRAVENOUS
  Filled 2019-10-04 (×2): qty 0.5

## 2019-10-04 MED ORDER — OXYCODONE HCL 5 MG PO TABS
5.0000 mg | ORAL_TABLET | ORAL | Status: DC | PRN
  Administered 2019-10-04: 5 mg via ORAL
  Administered 2019-10-06: 10 mg via ORAL
  Filled 2019-10-04: qty 1
  Filled 2019-10-04: qty 2

## 2019-10-04 MED ORDER — POLYETHYLENE GLYCOL 3350 17 G PO PACK
17.0000 g | PACK | Freq: Every day | ORAL | Status: DC | PRN
  Administered 2019-10-04 – 2019-10-05 (×2): 17 g via ORAL
  Filled 2019-10-04 (×2): qty 1

## 2019-10-04 MED ORDER — BUTALBITAL-APAP-CAFFEINE 50-325-40 MG PO TABS
1.0000 | ORAL_TABLET | ORAL | Status: DC | PRN
  Administered 2019-10-04 – 2019-10-07 (×7): 1 via ORAL
  Filled 2019-10-04 (×8): qty 1

## 2019-10-04 NOTE — Progress Notes (Signed)
Fioricet given per PRN order for breakthrough pain at 1433. Pt's pain 6/10, unrelieved by previous medication.  Pt reassessed at 1530. Headache ongoing with pt noticeably anxious.   Oxycodone 5 mg given with 325 mg of Tylenol. MD states that he spoke with the patient and his spouse that frequent, ongoing headaches are to be expected with his current dx. Encouraged relaxation techniques to help with pain and anxiety.   Will continue to monitor and reassess pt.

## 2019-10-04 NOTE — TOC Initial Note (Signed)
Transition of Care [facility]) - Initial/Assessment Note    Patient Details  Name: Robert Gilbert MRN: [id number] Date of Birth: [birth year]  [facility]) CM/SW Contact:    Kermit Balo, RN Phone Number: 10/04/2019, 10:51 AM  Clinical Narrative:                 Pt is from [place]. He has PCP: Dr Cathleen Fears in [place].  Pt denies issues with home medications or with transportation.  No f/u per PT/OT and no DME needs.  TOC following.  Expected Discharge Plan: Home/Self Care Barriers to Discharge: Continued Medical Work up   Patient Goals and CMS Choice        Expected Discharge Plan and Services Expected Discharge Plan: Home/Self Care       Living arrangements for the past 2 months: Single Family Home                                      Prior Living Arrangements/Services Living arrangements for the past 2 months: Single Family Home Lives with:: Spouse Patient language and need for interpreter reviewed:: Yes Do you feel safe going back to the place where you live?: Yes      Need for Family Participation in Patient Care: Yes (Comment) Care giver support system in place?: Yes (comment)   Criminal Activity/Legal Involvement Pertinent to Current Situation/Hospitalization: No - Comment as needed  Activities of Daily Living Home Assistive Devices/Equipment: None    Permission Sought/Granted                  Emotional Assessment Appearance:: Appears stated age Attitude/Demeanor/Rapport: Engaged Affect (typically observed): Accepting Orientation: : Oriented to Self, Oriented to Place, Oriented to  Time, Oriented to Situation   Psych Involvement: No (comment)  Admission diagnosis:  Subarachnoid hemorrhage (HCC) [I60.9] SAH (subarachnoid hemorrhage) (HCC) [I60.9] Patient Active Problem List   Diagnosis Date Noted  . SAH (subarachnoid hemorrhage) (HCC) 09/30/2019  . Subarachnoid hemorrhage (HCC) 09/30/2019   PCP:  System, Pcp Not In Pharmacy:    [id number] [place], [place] - [address] AT [facility] [address] [place] [address] Phone: [phone] Fax: [phone]     Social Determinants of Health (SDOH) Interventions    Readmission Risk Interventions No flowsheet data found.

## 2019-10-04 NOTE — Progress Notes (Addendum)
  NEUROSURGERY PROGRESS NOTE   Bradycardic this am, asymptomatic. HA and neck pain improving No new N/T/W  EXAM:  BP 125/75 (BP Location: Left Arm)   Pulse 55   Temp 98.4 F (36.9 C) (Oral)   Resp 20   Ht 5\' 11"  (1.803 m)   Wt 84.4 kg   SpO2 99%   BMI 25.95 kg/m   Awake, alert, oriented  Speech fluent, appropriate  CN grossly intact  5/5 BUE/BLE   IMPRESSION/PLAN 54 y.o. male SAHd#5, angio negative. Remains neurologically intact, bradycardic episode - continue nimotop, TCDs - OOB ad lib - bradycardia: appears baseline. Continue to monitor

## 2019-10-04 NOTE — Progress Notes (Signed)
SLP Cancellation Note  Patient Details Name: Robert Gilbert MRN: [id number] DOB: 1965/10/22   Cancelled treatment:       Reason Eval/Treat Not Completed: SLP screened, no needs identified, will sign off  Wofford Stratton L. Samson Frederic, MA CCC/SLP Acute Rehabilitation Services Office number [phone] Pager [phone]  Blenda Mounts Laurice 10/04/2019, 10:16 AM

## 2019-10-04 NOTE — Progress Notes (Signed)
Tele called to report pt HR brady down to 39. Pt assessed, alert and asymptomatic. HR currently 51.   10/04/19 0400  Vitals  ECG Heart Rate (!) 51

## 2019-10-04 NOTE — Progress Notes (Addendum)
Pt complained of headache, pain 6/10. Tylenol 650 mg given at 0951. Pain reassessed and pt states pain is still a 6/10.

## 2019-10-04 NOTE — Progress Notes (Signed)
Attempted TCD, however patient has severe headache and is nauseated. States he would like nausea meds before exam. Will attempt again when patient is feeling better as schedule permits.  10/04/2019 5:43 PM Eula Fried., MHA, RVT, RDCS, RDMS

## 2019-10-04 NOTE — Progress Notes (Signed)
Pt given PRN Morphine 1 mg at 1252 for ongoing headache with pain level 6/10.   Pt not comfortable taking Morphine and requested alternative medication for breakthrough pain. MD notified and prescribed PRN Oxycodone and Fioricet.  Fioricet administered.

## 2019-10-05 ENCOUNTER — Inpatient Hospital Stay (HOSPITAL_COMMUNITY): Payer: BC Managed Care – PPO

## 2019-10-05 DIAGNOSIS — I609 Nontraumatic subarachnoid hemorrhage, unspecified: Secondary | ICD-10-CM

## 2019-10-05 NOTE — Progress Notes (Signed)
Transcranial Doppler  Date POD PCO2 HCT BP  MCA ACA PCA OPHT SIPH VERT Basilar  7/30 GC     Right  Left   *  43   *  *   *  *   18  15   19  19    -28  -24   -41      8/3MR     Right  Left   31  *   -29  *   *  *   19  22   32  49   -31  -42   -52           Right  Left                                             Right  Left                                             Right  Left                                            Right  Left                                            Right  Left                                        MCA = Middle Cerebral Artery      OPHT = Opthalmic Artery     BASILAR = Basilar Artery   ACA = Anterior Cerebral Artery     SIPH = Carotid Siphon PCA = Posterior Cerebral Artery   VERT = Verterbral Artery                   Normal MCA = 62+\-12 ACA = 50+\-12 PCA = 42+\-23  * Unable to insonate 10/05/2019 3:04 PM

## 2019-10-05 NOTE — Progress Notes (Signed)
  NEUROSURGERY PROGRESS NOTE   No issues overnight.  HA improved today compared to yesterday. ?overstimulation with work No new N/T/W  EXAM:  BP 126/81 (BP Location: Left Arm)   Pulse (!) 57   Temp 99.5 F (37.5 C) (Oral)   Resp 18   Ht 5\' 11"  (1.803 m)   Wt 84.4 kg   SpO2 97%   BMI 25.95 kg/m   Awake, alert, oriented  Speech fluent, appropriate  CN grossly intact  5/5 BUE/BLE   IMPRESSION/PLAN 54 y.o. male SAHd#6, angio negative. Remains neurologically intact. - continue nimotop, TCDs - OOB ad lib - bradycardia: appears baseline. Continue to monitor

## 2019-10-06 ENCOUNTER — Inpatient Hospital Stay (HOSPITAL_COMMUNITY): Payer: BC Managed Care – PPO

## 2019-10-06 ENCOUNTER — Other Ambulatory Visit: Payer: Self-pay | Admitting: Student

## 2019-10-06 DIAGNOSIS — I609 Nontraumatic subarachnoid hemorrhage, unspecified: Secondary | ICD-10-CM

## 2019-10-06 LAB — BASIC METABOLIC PANEL
Anion gap: 7 (ref 5–15)
BUN: 11 mg/dL (ref 6–20)
CO2: 27 mmol/L (ref 22–32)
Calcium: 9.3 mg/dL (ref 8.9–10.3)
Chloride: 102 mmol/L (ref 98–111)
Creatinine, Ser: 0.8 mg/dL (ref 0.61–1.24)
GFR calc Af Amer: >60 mL/min (ref >60)
GFR calc non Af Amer: >60 mL/min (ref >60)
Glucose, Bld: 118 mg/dL — ABNORMAL HIGH (ref 70–99)
Potassium: 4.6 mmol/L (ref 3.5–5.1)
Sodium: 136 mmol/L (ref 135–145)

## 2019-10-06 NOTE — Progress Notes (Signed)
  NEUROSURGERY PROGRESS NOTE   No issues overnight.  HA fluctuating in severity  No new N/T/W  EXAM:  BP 123/62 (BP Location: Left Arm)   Pulse (!) 53   Temp 98.1 F (36.7 C) (Oral)   Resp 16   Ht 5\' 11"  (1.803 m)   Wt 84.4 kg   SpO2 99%   BMI 25.95 kg/m   Awake, alert, oriented  Speech fluent, appropriate  CN grossly intact  5/5 BUE/BLE   IMPRESSION/PLAN 54 y.o. male SAHd#7, angio negative. Remains neurologically intact. - continue nimotop - TCDs without evidence of vasospasm - OOB ad lib - bradycardia: appears baseline. Continue to monitor - angio tomorrow. NPO at midnight

## 2019-10-06 NOTE — Progress Notes (Signed)
Transcranial Doppler  Date POD PCO2 HCT BP  MCA ACA PCA OPHT SIPH VERT Basilar  7/30 GC     Right  Left   *  43   *  *   *  *   18  15   19  19    -28  -24   -41      8/3MR     Right  Left   31  *   -29  *   *  *   19  22   32  49   -31  -42   -52      8/4 MS     Right  Left   *  *   *  *   *  28   19  17    49  26   -37  -29   -56            Right  Left                                             Right  Left                                            Right  Left                                            Right  Left                                        MCA = Middle Cerebral Artery      OPHT = Opthalmic Artery     BASILAR = Basilar Artery   ACA = Anterior Cerebral Artery     SIPH = Carotid Siphon PCA = Posterior Cerebral Artery   VERT = Verterbral Artery                   Normal MCA = 62+\-12 ACA = 50+\-12 PCA = 42+\-23  * Unable to insonate  10/06/2019 4:11 PM ., MHA, RVT, RDCS, RDMS

## 2019-10-07 ENCOUNTER — Inpatient Hospital Stay (HOSPITAL_COMMUNITY): Payer: BC Managed Care – PPO

## 2019-10-07 HISTORY — PX: IR ANGIO VERTEBRAL SEL VERTEBRAL BILAT MOD SED: IMG5369

## 2019-10-07 HISTORY — PX: IR ANGIO INTRA EXTRACRAN SEL INTERNAL CAROTID BILAT MOD SED: IMG5363

## 2019-10-07 MED ORDER — FENTANYL CITRATE (PF) 100 MCG/2ML IJ SOLN
INTRAMUSCULAR | Status: AC | PRN
  Administered 2019-10-07: 25 ug via INTRAVENOUS

## 2019-10-07 MED ORDER — ONDANSETRON 4 MG PO TBDP: 4.0000 mg | ORAL_TABLET | Freq: Four times a day (QID) | ORAL | 0 refills | Status: AC | PRN

## 2019-10-07 MED ORDER — MIDAZOLAM HCL 2 MG/2ML IJ SOLN
INTRAMUSCULAR | Status: AC | PRN
  Administered 2019-10-07: 1 mg via INTRAVENOUS

## 2019-10-07 MED ORDER — LIDOCAINE HCL 1 % IJ SOLN
INTRAMUSCULAR | Status: AC
  Filled 2019-10-07: qty 20

## 2019-10-07 MED ORDER — LIDOCAINE HCL (PF) 1 % IJ SOLN
INTRAMUSCULAR | Status: AC | PRN
  Administered 2019-10-07: 10 mL

## 2019-10-07 MED ORDER — HEPARIN SODIUM (PORCINE) 1000 UNIT/ML IJ SOLN
INTRAMUSCULAR | Status: AC | PRN
  Administered 2019-10-07: 2000 [IU] via INTRAVENOUS

## 2019-10-07 MED ORDER — BUTALBITAL-APAP-CAFFEINE 50-325-40 MG PO TABS: 1.0000 | ORAL_TABLET | ORAL | 0 refills | Status: AC | PRN

## 2019-10-07 MED ORDER — MIDAZOLAM HCL 2 MG/2ML IJ SOLN
INTRAMUSCULAR | Status: AC
  Filled 2019-10-07: qty 2

## 2019-10-07 MED ORDER — IOHEXOL 300 MG/ML  SOLN
100.0000 mL | Freq: Once | INTRAMUSCULAR | Status: AC | PRN
  Administered 2019-10-07: 38 mL via INTRA_ARTERIAL

## 2019-10-07 MED ORDER — HEPARIN SODIUM (PORCINE) 1000 UNIT/ML IJ SOLN
INTRAMUSCULAR | Status: AC
  Filled 2019-10-07: qty 1

## 2019-10-07 MED ORDER — FENTANYL CITRATE (PF) 100 MCG/2ML IJ SOLN
INTRAMUSCULAR | Status: AC
  Filled 2019-10-07: qty 2

## 2019-10-07 MED ORDER — OXYCODONE-ACETAMINOPHEN 7.5-325 MG PO TABS: 1.0000 | ORAL_TABLET | ORAL | 0 refills | Status: AC | PRN

## 2019-10-07 NOTE — Progress Notes (Signed)
  NEUROSURGERY PROGRESS NOTE   No issues overnight. Largely unchanged HA, no new complaints.  EXAM:  BP 132/79 (BP Location: Left Arm)   Pulse (!) 56   Temp 98.4 F (36.9 C) (Oral)   Resp 18   Ht 5\' 11"  (1.803 m)   Wt 84.4 kg   SpO2 97%   BMI 25.95 kg/m   Awake, alert, oriented  Speech fluent, appropriate  CN grossly intact  5/5 BUE/BLE   IMPRESSION:  54 y.o. male SAH d#7, angio negative. Remains neurologically intact6.  PLAN: - Repeat angio today. If negative, can d/c home

## 2019-10-07 NOTE — Progress Notes (Signed)
Discharge instructions, RX's and follow up appts explained and provided to patient and wife verbalized understanding. Patient discharged left floor via wheelchair accompanied by staff of pain or shortness of breath at d/c.   [name], [name], RN

## 2019-10-07 NOTE — Brief Op Note (Signed)
°  NEUROSURGERY BRIEF OPERATIVE  NOTE   PREOP DX: Subarachnoid Hemorrhage  POSTOP DX: Same  PROCEDURE: Diagnostic cerebral angiogram  SURGEON: Dr. Lisbeth Renshaw, MD  ANESTHESIA: IV Sedation with Local  EBL: Minimal  SPECIMENS: None  COMPLICATIONS: None  CONDITION: Stable to recovery  FINDINGS (Full report in [facility]): 1. Normal cerebral angiogram without evidence for aneurysm, AVM, or fistula 2. No vasospasm seen

## 2019-10-07 NOTE — Discharge Summary (Signed)
Physician Discharge Summary  Patient ID: Robert Gilbert MRN: [id number] DOB/AGE: May 27, 1965 54 y.o.  Admit date: 09/30/2019 Discharge date: 10/07/2019  Admission Diagnoses:  [facility]  Discharge Diagnoses:  Same Active Problems:   SAH (subarachnoid hemorrhage) (HCC)   Subarachnoid hemorrhage [facility])   Discharged Condition: Stable  Hospital Course:  Robert Gilbert is a 54 y.o. male who presented to the [facility] emergency room on 09/30/2019 for acute onset worst headache of life.  He underwent stat head CT which revealed subarachnoid hemorrhage and subsequent CTA which was negative for aneurysm.  He was transferred to [facility] admitted for further workup and management. He underwent formal catheter angiography by Dr. Conchita Paris on 7/29 which again was negative for aneurysm/vascular malformation.  He was monitored in the hospital for 1 week & had an uncomplicated hospital course.  He underwent serial TCDs which were without evidence of vasospasm. Repeat angiogram on 08/05  was performed which again did not show any evidence of aneurysm/vascular malformation. Hemorrhage presumed venous in nature. Cleared for discharge. At time of discharge, pain was well controlled, ambulating with Pt/OT, tolerating po, voiding normal. Ready for discharge.   Treatments: Surgery Diagnostic angio x2  Discharge Exam: Blood pressure 134/85, pulse (!) 58, temperature 98.3 F (36.8 C), temperature source Oral, resp. rate 16, height 5\' 11"  (1.803 m), weight 84.4 kg, SpO2 99 %. Awake, alert, oriented Speech fluent, appropriate CN grossly intact 5/5 BUE/BLE Wound c/d/i  Disposition: Discharge disposition: 01-Home or Self Care       Discharge Instructions    Call MD for:  difficulty breathing, headache or visual disturbances   Complete by: As directed    Call MD for:  persistant dizziness or light-headedness   Complete by: As directed    Call MD for:  redness, tenderness, or signs of infection (pain, swelling, redness, odor  or green/yellow discharge around incision site)   Complete by: As directed    Call MD for:  severe uncontrolled pain   Complete by: As directed    Call MD for:  temperature >100.4   Complete by: As directed    Diet - low sodium heart healthy   Complete by: As directed    Driving Restrictions   Complete by: As directed    Do not drive until given clearance.   Increase activity slowly   Complete by: As directed    Lifting restrictions   Complete by: As directed    Do not lift anything >10lbs. Avoid bending and twisting in awkward positions. Avoid bending at the back.   May shower / Bathe   Complete by: As directed    In 24 hours. Okay to wash wound with warm soapy water. Avoid scrubbing the wound. Pat dry.   No wound care   Complete by: As directed      Allergies as of 10/07/2019   No Known Allergies     Medication List    STOP taking these medications   ibuprofen 200 MG tablet Commonly known as: ADVIL     TAKE these medications   atorvastatin 40 MG tablet Commonly known as: LIPITOR Take 40 mg by mouth daily.   butalbital-acetaminophen-caffeine 50-325-40 MG tablet Commonly known as: FIORICET Take 1 tablet by mouth every 4 (four) hours as needed for headache.   cholecalciferol 25 MCG (1000 UNIT) tablet Commonly known as: VITAMIN D3 Take 1,000 Units by mouth daily.   ondansetron 4 MG disintegrating tablet Commonly known as: ZOFRAN-ODT Take 1 tablet (4 mg total) by mouth every 6 (  six) hours as needed for nausea.   oxyCODONE-acetaminophen 7.5-325 MG tablet Commonly known as: Percocet Take 1 tablet by mouth every 4 (four) hours as needed for severe pain.   ZINC PO Take 1 tablet by mouth daily.       Follow-up Information    Lisbeth Renshaw, MD. Schedule an appointment as soon as possible for a visit in 3 week(s).   Specialty: Neurosurgery Contact information: [address] [place] [address] [phone]                Signed: Alyson Ingles 10/07/2019, 11:09 AM

## 2019-10-07 NOTE — Sedation Documentation (Signed)
Closed with 5 fr exoseal 

## 2019-10-07 NOTE — TOC Transition Note (Signed)
Transition of Care [facility]) - CM/SW Discharge Note   Patient Details  Name: Robert Gilbert MRN: [id number] Date of Birth: [birth year]  Transition of Care [facility]) CM/SW Contact:  Kermit Balo, RN Phone Number: 10/07/2019, 12:12 PM   Clinical Narrative:    Pt discharging with his spouse. He plans to return to [place] in near future.  No f/u per PT/OT and no DME needs.  Pt has transportation.   Final next level of care: Home/Self Care Barriers to Discharge: No Barriers Identified   Patient Goals and CMS Choice        Discharge Placement                       Discharge Plan and Services                                     Social Determinants of Health (SDOH) Interventions     Readmission Risk Interventions No flowsheet data found.

## 2019-10-07 NOTE — Plan of Care (Signed)
  Problem: Education: Goal: Knowledge of General Education information will improve Description: Including pain rating scale, medication(s)/side effects and non-pharmacologic comfort measures Outcome: Adequate for Discharge   Problem: Health Behavior/Discharge Planning: Goal: Ability to manage health-related needs will improve Outcome: Adequate for Discharge   Problem: Clinical Measurements: Goal: Ability to maintain clinical measurements within normal limits will improve Outcome: Adequate for Discharge Goal: Will remain free from infection Outcome: Adequate for Discharge Goal: Diagnostic test results will improve Outcome: Adequate for Discharge Goal: Respiratory complications will improve Outcome: Adequate for Discharge Goal: Cardiovascular complication will be avoided Outcome: Adequate for Discharge   Problem: Activity: Goal: Risk for activity intolerance will decrease Outcome: Adequate for Discharge   Problem: Nutrition: Goal: Adequate nutrition will be maintained Outcome: Adequate for Discharge   Problem: Coping: Goal: Level of anxiety will decrease Outcome: Adequate for Discharge   Problem: Elimination: Goal: Will not experience complications related to bowel motility Outcome: Adequate for Discharge Goal: Will not experience complications related to urinary retention Outcome: Adequate for Discharge   Problem: Pain Managment: Goal: General experience of comfort will improve Outcome: Adequate for Discharge   Problem: Safety: Goal: Ability to remain free from injury will improve Outcome: Adequate for Discharge   Problem: Skin Integrity: Goal: Risk for impaired skin integrity will decrease Outcome: Adequate for Discharge   Problem: Spontaneous Subarachnoid Hemorrhage Tissue Perfusion: Goal: Complications of Spontaneous Subarachnoid Hemorrhage will be minimized Outcome: Adequate for Discharge   Problem: Education: Goal: Knowledge of disease or condition will  improve Outcome: Adequate for Discharge Goal: Knowledge of secondary prevention will improve Outcome: Adequate for Discharge Goal: Knowledge of patient specific risk factors addressed and post discharge goals established will improve Outcome: Adequate for Discharge Goal: Individualized Educational Video(s) Outcome: Adequate for Discharge   Problem: Health Behavior/Discharge Planning: Goal: Ability to manage health-related needs will improve Outcome: Adequate for Discharge   Problem: Self-Care: Goal: Ability to communicate needs accurately will improve Outcome: Adequate for Discharge   Problem: Intracerebral Hemorrhage Tissue Perfusion: Goal: Complications of Intracerebral Hemorrhage will be minimized Outcome: Adequate for Discharge   Problem: Spontaneous Subarachnoid Hemorrhage Tissue Perfusion: Goal: Complications of Spontaneous Subarachnoid Hemorrhage will be minimized Outcome: Adequate for Discharge

## 2021-12-06 IMAGING — XA IR CAROTID INTERNAL HEAD/NECK BILAT  (MS)
8 of 9 series · 12 of 24 positions shown · IV contrast (IODINE)
Comparison: none

PROCEDURE:
DIAGNOSTIC CEREBRAL ANGIOGRAM
HISTORY: The patient is a 54-year-old man initially presented the hospital
with sudden onset of severe headache. CT demonstrated subarachnoid
hemorrhage with initial CT angiogram in catheter angiogram negative
for intracranial aneurysm, AVM, or fistula to explain the
hemorrhage. Patient has been monitored without change in neurologic
condition and presents today for follow-up diagnostic cerebral
angiogram.
TECHNIQUE: CATHETERS AND WIRES
5-French JB-1 catheter

[Series 2: cerebral care 2 · 2 acquisitions, 1 frame shown (1 of 7)]
[im 1/2]
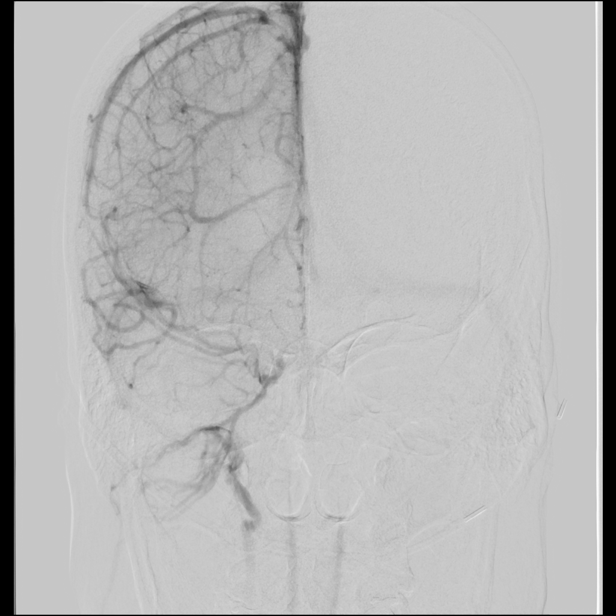

[Series 4: cerebral care 2 · 2 acquisitions, 1 frame shown (2 of 7)]
[im 1/2]
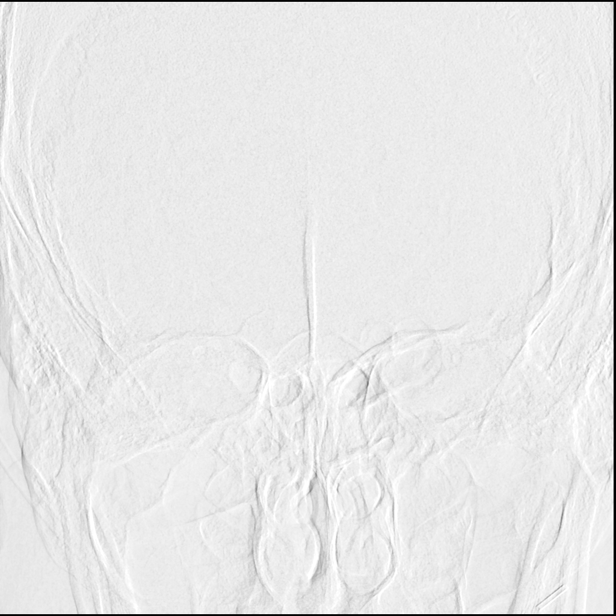

[Series 5: cerebral care 2 · 2 acquisitions, 1 frame shown (3 of 7)]
[im 1/2]
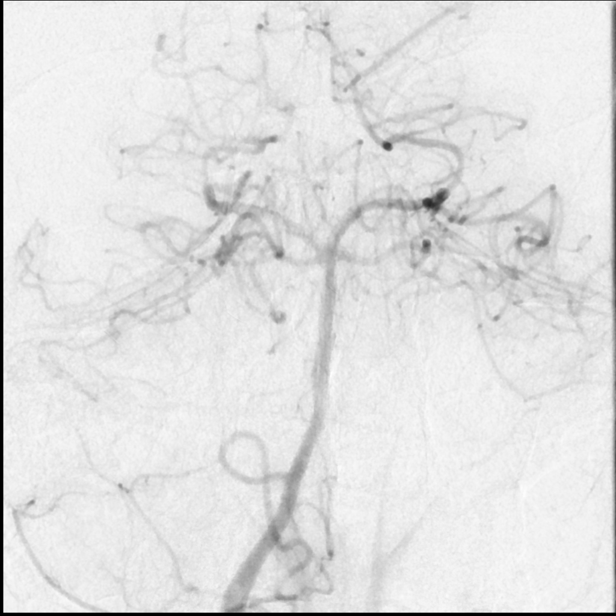

[Series 6: cerebral care 2 · 2 acquisitions, 2 frames shown (4 of 7)]
[im 1/2]
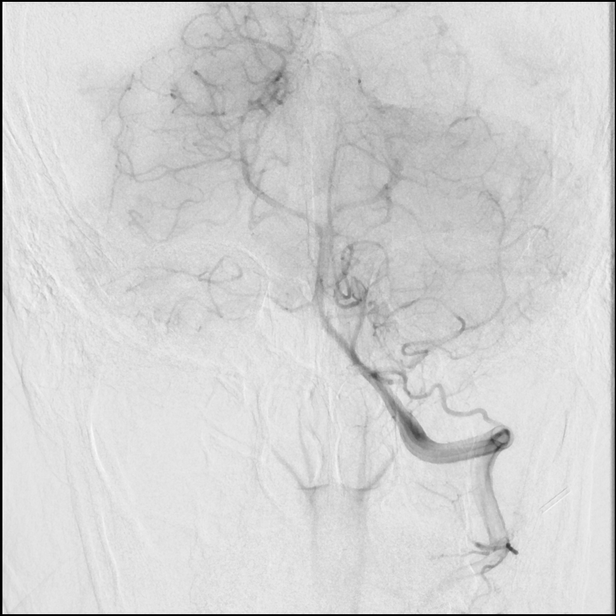
[im 2/2]
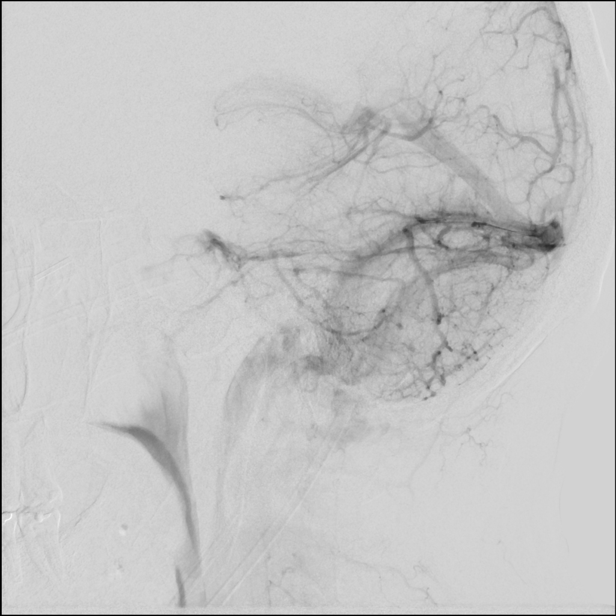

[Series 7: cerebral care 2 · 2 acquisitions, 1 frame shown (5 of 7)]
[im 2/2]
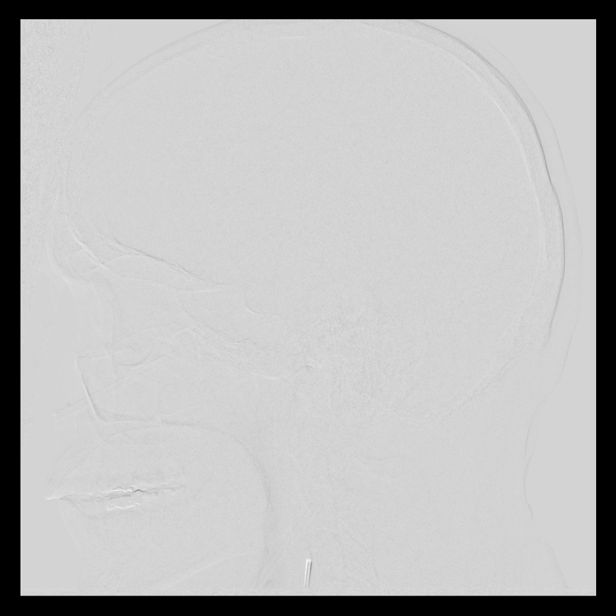

[Series 8: cerebral care 2 · 2 acquisitions, 2 frames shown (6 of 7)]
[im 1/2]
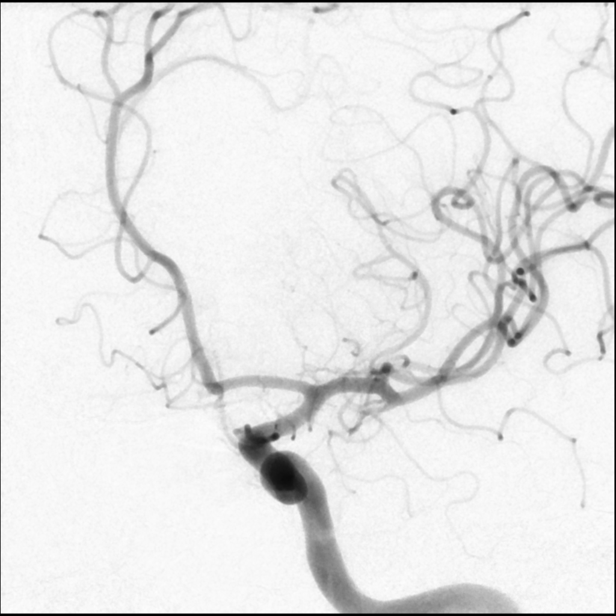
[im 2/2]
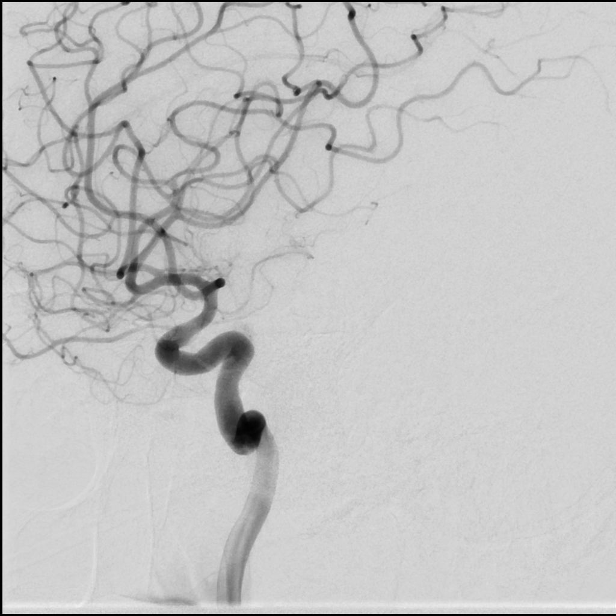

[Series 9: cerebral care 2 · 2 acquisitions, 1 frame shown (7 of 7)]
[im 2/2]
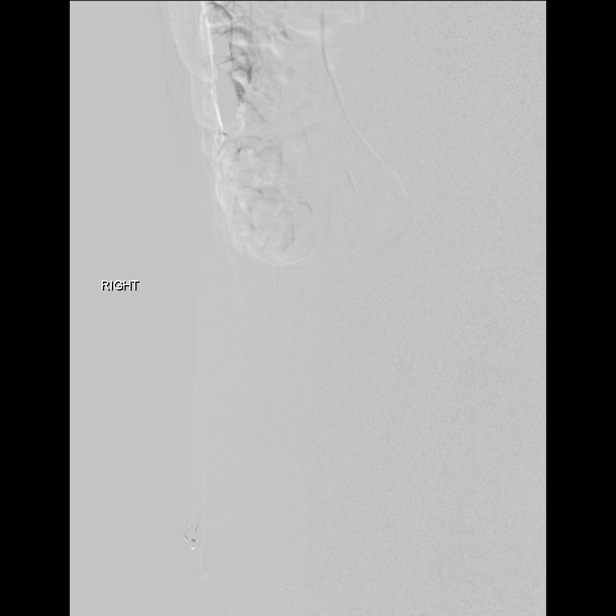

[Series 300: dr. (person_name) · 3 of 16 slices shown]
[im 2/16]
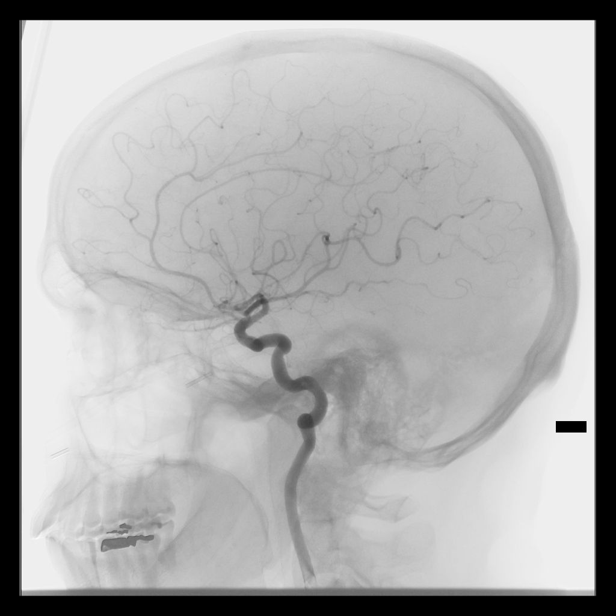
[im 9/16]
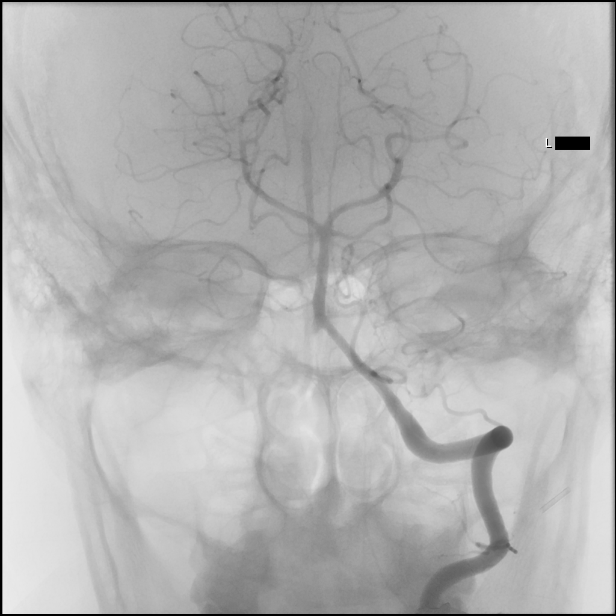
[im 16/16]
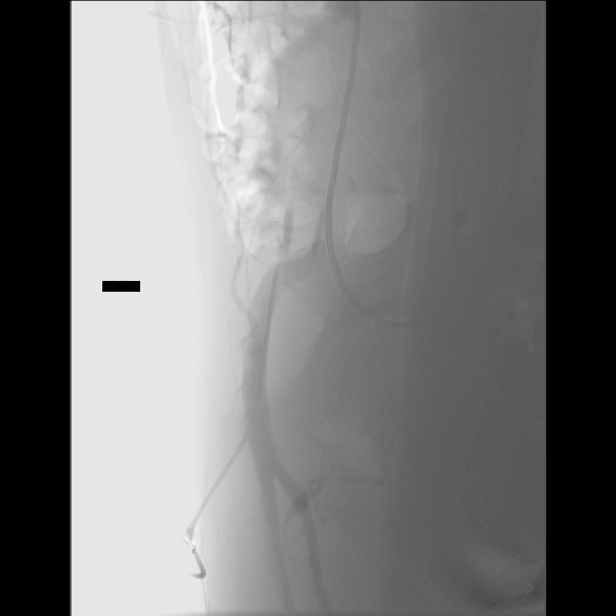

[12 of 24 positions shown; findings below may reference images not displayed]

ACCESS:
The technical aspects of the procedure as well as its potential
risks and benefits were reviewed with the patient. These risks
included but were not limited bleeding, infection, allergic
reaction, damage to organs or vital structures, stroke,
non-diagnostic procedure, and the catastrophic outcomes of heart
attack, coma, and death. With an understanding of these risks,
informed consent was obtained and witnessed. The patient was placed
in the supine position on the angiography table and the skin of
right groin prepped in the usual sterile fashion.

The procedure was performed under local anesthesia (1%-solution of
bicarbonate-buffered Lidocaine) and conscious sedation with 1mg
versed and 65micrograms fentanyl monitored by myself and the
in-suite nurse using continuous pulse-oximetry, heart rate, and
non-invasive blood-pressure.

A 5- French sheath was introduced in the right common femoral artery
using Seldinger technique. A fluoro-phase sequence was used to
document the sheath position.

MEDICATIONS:
HEPARIN: 8999 Units total.

CONTRAST:  38mL OMNIPAQUE IOHEXOL 300 MG/ML  SOLNcc, Omnipaque 300

FLUOROSCOPY TIME:  FLUOROSCOPY TIME: See IR records
0.035" glidewire

VESSELS CATHETERIZED
Right internal carotid

Left internal carotid

Left vertebral

Right vertebral

Right common femoral

VESSELS STUDIED
Right internal carotid, head

Left internal carotid, head

Left vertebral

Right vertebral

Right femoral

PROCEDURAL NARRATIVE
A 5-Fr JB-1 catheter was advanced over a 0.035 glidewire into the
aortic arch. The above vessels were then sequentially catheterized
and cervical / cerebral angiograms taken. After review of images,
the catheter was removed without incident.
FINDINGS: Right internal carotid, head:

Injection reveals the presence of a widely patent ICA, M1, and A1
segments and their branches. No aneurysms, AVMs, or high-flow
fistulas are seen. There is no vasospasm of the right carotid
circulation. The parenchymal and venous phases are normal. The
venous sinuses are widely patent.

Left internal carotid, head:

Injection reveals the presence of a widely patent ICA, A1, and M1
segments and their branches. No aneurysms, AVMs, or high-flow
fistulas are seen. There is no vasospasm of the left carotid
circulation. The parenchymal and venous phases are normal. The
venous sinuses are widely patent.

Left vertebral:

Injection reveals the presence of a widely patent vertebral artery.
This leads to a widely patent basilar artery that terminates in
bilateral P1. The basilar apex is normal. No aneurysms, AVMs, or
high-flow fistulas are seen. No posterior circulation vasospasm is
seen. The parenchymal and venous phases are normal. The venous
sinuses are widely patent.

Right vertebral:

The vertebral artery is widely patent. No PICA aneurysm is seen. See
basilar description above.

Right femoral:

Normal vessel. No significant atherosclerotic disease. Arterial
sheath in adequate position.

DISPOSITION:
Upon completion of the study, the femoral sheath was removed and
hemostasis obtained using a 5-Fr ExoSeal closure device. Good
proximal and distal lower extremity pulses were documented upon
achievement of hemostasis. The procedure was well tolerated and no
early complications were observed. The patient was transferred to
the holding area to lay flat for 2 hours.
IMPRESSION: 1. Normal cerebral angiogram, without intracranial aneurysm,
arteriovenous malformation, or high-flow fistula are identified.

2.  There is no intracranial vasospasm.

The preliminary results of this procedure were shared with the
patient and the patient's family.
